# Patient Record
Sex: Male | Born: 2000 | Race: White | Hispanic: No | Marital: Single | Smoking: Never smoker
Health system: Southern US, Community
[De-identification: ages and names within clinical notes are randomized; demographics above are authoritative.]

## PROBLEM LIST (undated history)

## (undated) DIAGNOSIS — S32059A Unspecified fracture of fifth lumbar vertebra, initial encounter for closed fracture: Secondary | ICD-10-CM

## (undated) HISTORY — DX: Unspecified fracture of fifth lumbar vertebra, initial encounter for closed fracture: S32.059A

---
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
## 2022-01-06 DIAGNOSIS — J322 Chronic ethmoidal sinusitis: Secondary | ICD-10-CM | POA: Diagnosis not present

## 2022-02-27 ENCOUNTER — Telehealth: Payer: Self-pay | Admitting: Pediatrics

## 2022-02-27 NOTE — Telephone Encounter (Signed)
Left message with father to have patient call and schedule a new patient appointment with Dr Darrick Huntsman. Ok'd by Darrick Huntsman on 02/26/2022.

## 2022-04-08 ENCOUNTER — Ambulatory Visit: Payer: BLUE CROSS/BLUE SHIELD | Admitting: Internal Medicine

## 2022-04-22 ENCOUNTER — Ambulatory Visit (INDEPENDENT_AMBULATORY_CARE_PROVIDER_SITE_OTHER): Payer: 59 | Admitting: Internal Medicine

## 2022-04-22 ENCOUNTER — Other Ambulatory Visit (HOSPITAL_COMMUNITY)
Admission: RE | Admit: 2022-04-22 | Discharge: 2022-04-22 | Disposition: A | Discharge status: Home / Self Care | Payer: 59 | Source: Ambulatory Visit | Attending: Internal Medicine | Admitting: Internal Medicine

## 2022-04-22 ENCOUNTER — Encounter: Payer: Self-pay | Admitting: Internal Medicine

## 2022-04-22 VITALS — BP 122/88 | HR 72 | Temp 98.3°F | Ht 68.25 in | Wt 155.2 lb

## 2022-04-22 DIAGNOSIS — F419 Anxiety disorder, unspecified: Secondary | ICD-10-CM | POA: Diagnosis not present

## 2022-04-22 DIAGNOSIS — Z113 Encounter for screening for infections with a predominantly sexual mode of transmission: Secondary | ICD-10-CM

## 2022-04-22 DIAGNOSIS — Z87312 Personal history of (healed) stress fracture: Secondary | ICD-10-CM

## 2022-04-22 DIAGNOSIS — R69 Illness, unspecified: Secondary | ICD-10-CM | POA: Diagnosis not present

## 2022-04-22 DIAGNOSIS — F411 Generalized anxiety disorder: Secondary | ICD-10-CM | POA: Diagnosis not present

## 2022-04-22 DIAGNOSIS — Z0001 Encounter for general adult medical examination with abnormal findings: Secondary | ICD-10-CM | POA: Insufficient documentation

## 2022-04-22 LAB — COMPREHENSIVE METABOLIC PANEL
ALT: 20 U/L (ref 0–53)
AST: 21 U/L (ref 0–37)
Albumin: 5.2 g/dL (ref 3.5–5.2)
Alkaline Phosphatase: 73 U/L (ref 39–117)
BUN: 15 mg/dL (ref 6–23)
CO2: 27 mEq/L (ref 19–32)
Calcium: 9.8 mg/dL (ref 8.4–10.5)
Chloride: 100 mEq/L (ref 96–112)
Creatinine, Ser: 1.18 mg/dL (ref 0.40–1.50)
GFR: 88.89 mL/min (ref >60.00)
Glucose, Bld: 78 mg/dL (ref 70–99)
Potassium: 3.9 mEq/L (ref 3.5–5.1)
Sodium: 138 mEq/L (ref 135–145)
Total Bilirubin: 1.3 mg/dL — ABNORMAL HIGH (ref 0.2–1.2)
Total Protein: 7.7 g/dL (ref 6.0–8.3)

## 2022-04-22 LAB — CBC WITH DIFFERENTIAL/PLATELET
Basophils Absolute: 0 10*3/uL (ref 0.0–0.1)
Basophils Relative: 0.6 % (ref 0.0–3.0)
Eosinophils Absolute: 0.2 10*3/uL (ref 0.0–0.7)
Eosinophils Relative: 3.1 % (ref 0.0–5.0)
HCT: 51.4 % (ref 39.0–52.0)
Hemoglobin: 17.2 g/dL — ABNORMAL HIGH (ref 13.0–17.0)
Lymphocytes Relative: 39.6 % (ref 12.0–46.0)
Lymphs Abs: 2.2 10*3/uL (ref 0.7–4.0)
MCHC: 33.5 g/dL (ref 30.0–36.0)
MCV: 95 fl (ref 78.0–100.0)
Monocytes Absolute: 0.4 10*3/uL (ref 0.1–1.0)
Monocytes Relative: 7.7 % (ref 3.0–12.0)
Neutro Abs: 2.7 10*3/uL (ref 1.4–7.7)
Neutrophils Relative %: 49 % (ref 43.0–77.0)
Platelets: 195 10*3/uL (ref 150.0–400.0)
RBC: 5.41 Mil/uL (ref 4.22–5.81)
RDW: 12.9 % (ref 11.5–14.6)
WBC: 5.4 10*3/uL (ref 4.5–10.5)

## 2022-04-22 LAB — TSH: TSH: 1.03 u[IU]/mL (ref 0.35–5.50)

## 2022-04-22 MED ORDER — SERTRALINE HCL 50 MG PO TABS: 50.0000 mg | ORAL_TABLET | Freq: Every day | ORAL | 3 refills | Status: DC

## 2022-04-22 NOTE — Assessment & Plan Note (Signed)
No prior screening in sexually active college student.  HIV< Hep C, GC/Chlamydia to be screened today

## 2022-04-22 NOTE — Patient Instructions (Signed)
I agree with reducing your caffeine intake since this will aggravate anxiety  Please start the  Sertraline (generic for Zoloft) at 1/2 tablet daily in the morning with breakfast or with lunch for the first  4 or 5 days   to avoid  nausea.  You can increase to a full tablet after 4-5 days if you have  not developed side effects of nausea.    You should start to feel a difference after two weeks on the full dose .  Please schedule a 15 minute phone or virtual visit with me to follow up

## 2022-04-22 NOTE — Assessment & Plan Note (Signed)
L5,  occurred in 2017 during a soccer kick. Healed without intervention

## 2022-04-22 NOTE — Assessment & Plan Note (Addendum)
Screened for substance abuse,  Sexual abuse,  ADD ,  Mania,  OCD.  No panic attacks.  Agree with reduction in daily caffeine use.  Trial of zoloft starting dose 25 mg for several days,  Then 50 mg daily.  Follow up 2.5 weeks.  Screen for anemia, electrolyte disturbances and thyroid dysfunction

## 2022-04-22 NOTE — Progress Notes (Signed)
New Patient Office Visit  Subjective    Patient ID: Jacob Bolyard., male    DOB: 2001/03/04  Age: 21 y.o. MRN: [id number]  CC:  Chief Complaint  Patient presents with   Establish Care    New patient    HPI Jacob Hoeg Ewell Poe. ,  who prefers to be called "Jacob Meyer",  presents to establish care, referred by his father Jacob  Meyer year college student at [employer] .  Resumes classes on Monday  Cc:  anxiety described as excessive worrying about anything symptoms have been present since high school, buy has gotten progressively worse this summer.  Occurs while driving, almost had to pull over.  Has not happened during class, or during test taking.    Doing well in school .  GPS is 3.5  Drinks a lot of caffeine,  works out a lot at a [employer] .  Anxiety is worse at night (after 5 pm) and is aggravated by the caffeine so he has reduced the caffeine to 50 to 80 mg (down from 200 mg  in the form of Red Bull,  celsius ) . Denies alcohol and illicits.   He denies and recent or past history of sexual abuse, physical abuse,  drug abuse, and trauma   Outpatient Encounter Medications as of 04/22/2022  Medication Sig   ibuprofen (ADVIL,MOTRIN) 200 MG tablet Take 200 mg by mouth as needed.   sertraline (ZOLOFT) 50 MG tablet Take 1 tablet (50 mg total) by mouth daily.   [DISCONTINUED] doxycycline (DORYX) 100 MG EC tablet Take 100 mg by mouth 2 (two) times daily. (Patient not taking: Reported on 04/22/2022)   [DISCONTINUED] meloxicam (MOBIC) 7.5 MG tablet Take 1 tablet (7.5 mg total) by mouth daily. (Patient not taking: Reported on 04/22/2022)   No facility-administered encounter medications on file as of 04/22/2022.    Past Medical History:  Diagnosis Date   L5 vertebral fracture (HCC)    stress fracture during soccer kick    Past Surgical History:  Procedure Laterality Date   tonsillectomy  04/21/2012    No family history on file.  Social History    Socioeconomic History   Marital status: Single    Spouse name: Not on file   Number of children: Not on file   Years of education: Not on file   Highest education level: Not on file  Occupational History   Not on file  Tobacco Use   Smoking status: Never   Smokeless tobacco: Not on file  Substance and Sexual Activity   Alcohol use: Not on file   Drug use: Not on file   Sexual activity: Not on file  Other Topics Concern   Not on file  Social History Narrative   Not on file   Social Determinants of Health   Financial Resource Strain: Not on file  Food Insecurity: Not on file  Transportation Needs: Not on file  Physical Activity: Not on file  Stress: Not on file  Social Connections: Not on file  Intimate Partner Violence: Not on file    ROS   Patient denies headache, fevers, malaise, unintentional weight loss, skin rash, eye pain, sinus congestion and sinus pain, sore throat, dysphagia,  hemoptysis , cough, dyspnea, wheezing, chest pain, palpitations, orthopnea, edema, abdominal pain, nausea, melena, diarrhea, constipation, flank pain, dysuria, hematuria, urinary  Frequency, nocturia, numbness, tingling, seizures,  Focal weakness, Loss of consciousness,  Tremor, insomnia, depression, anxiety, and suicidal ideation.  Objective    BP 122/88 (BP Location: Left Arm, Patient Position: Sitting, Cuff Size: Normal)   Pulse 72   Temp 98.3 F (36.8 C) (Oral)   Ht 5' 8.25" (1.734 m)   Wt 155 lb 3.2 oz (70.4 kg)   SpO2 98%   BMI 23.43 kg/m   Physical Exam  General appearance: alert, cooperative and appears stated age Ears: normal TM's and external ear canals both ears Throat: lips, mucosa, and tongue normal; teeth and gums normal Neck: no adenopathy, no carotid bruit, supple, symmetrical, trachea midline and thyroid not enlarged, symmetric, no tenderness/mass/nodules Back: symmetric, no curvature. ROM normal. No CVA tenderness. Lungs: clear to auscultation  bilaterally Heart: regular rate and rhythm, S1, S2 normal, no murmur, click, rub or gallop Abdomen: soft, non-tender; bowel sounds normal; no masses,  no organomegaly Pulses: 2+ and symmetric Skin: Skin color, texture, turgor normal. No rashes or lesions Lymph nodes: Cervical, supraclavicular, and axillary nodes normal.    Assessment & Plan:   Problem List Items Addressed This Visit     History of vertebral stress fracture    L5,  occurred in 2017 during a soccer kick. Healed without intervention       Generalized anxiety disorder    Screened for substance abuse,  Sexual abuse,  ADD ,  Mania,  OCD.  No panic attacks.  Agree with reduction in daily caffeine use.  Trial of zoloft starting dose 25 mg for several days,  Then 50 mg daily.  Follow up 2.5 weeks.  Screen for anemia, electrolyte disturbances and thyroid dysfunction        Relevant Medications   sertraline (ZOLOFT) 50 MG tablet   Screening for STD (sexually transmitted disease)    No prior screening in sexually active college student.  HIV< Hep C, GC/Chlamydia to be screened today      Other Visit Diagnoses     Screen for STD (sexually transmitted disease)    -  Primary   Relevant Orders   HIV antibody (with reflex)   Hepatitis C antibody   Urine cytology ancillary only([facility])   Anxiety       Relevant Medications   sertraline (ZOLOFT) 50 MG tablet   Other Relevant Orders   TSH   Comprehensive metabolic panel   CBC with Differential/Platelet       No follow-ups on file.   Sherlene Shams, MD

## 2022-04-23 LAB — URINE CYTOLOGY ANCILLARY ONLY
Chlamydia: NEGATIVE
Comment: NEGATIVE
Comment: NORMAL
Neisseria Gonorrhea: NEGATIVE

## 2022-04-23 LAB — HEPATITIS C ANTIBODY: Hepatitis C Ab: NONREACTIVE

## 2022-04-23 LAB — HIV ANTIBODY (ROUTINE TESTING W REFLEX): HIV 1&2 Ab, 4th Generation: NONREACTIVE

## 2022-05-27 ENCOUNTER — Ambulatory Visit (INDEPENDENT_AMBULATORY_CARE_PROVIDER_SITE_OTHER): Payer: 59 | Admitting: Internal Medicine

## 2022-05-27 ENCOUNTER — Encounter: Payer: Self-pay | Admitting: Internal Medicine

## 2022-05-27 DIAGNOSIS — F411 Generalized anxiety disorder: Secondary | ICD-10-CM

## 2022-05-27 DIAGNOSIS — R69 Illness, unspecified: Secondary | ICD-10-CM | POA: Diagnosis not present

## 2022-05-27 NOTE — Patient Instructions (Signed)
Continue 50 mg zoloft until Saturday,  then increase dose to 100 mg daily  Send me a message in 2 weeks to let me know how it's working   Jacob Meyer

## 2022-05-28 NOTE — Progress Notes (Signed)
Virtual Visit via Caregility   Note    This format is felt to be most appropriate for this patient at this time.  All issues noted in this document were discussed and addressed.  No physical exam was performed (except for noted visual exam findings with Video Visits).   I connected with Jacob Meyer on 05/28/22 at  4:00 PM EDT by a video enabled telemedicine application  and verified that I am speaking with the correct person using two identifiers. Location patient: home Location provider: work or home office Persons participating in the virtual visit: patient, provider  I discussed the limitations, risks, security and privacy concerns of performing an evaluation and management service by telephone and the availability of in person appointments. I also discussed with the patient that there may be a patient responsible charge related to this service. The patient expressed understanding and agreed to proceed.   Reason for visit: follow up on anxiety   HP 21 yr old male with GAD recently started on zoloft  25 mg starting dose presents for folllo up. He is tolerating medicaton without side effects..  had one night recently where he overindulged in RedBull and did not sleep that night.  However all other nights has noted improved sleep patterns .  Marland Kitchen  He denies manic symptoms . suicidality and destructive behavior .Marland Kitchen      ROS: See pertinent positives and negatives per HPI.  Past Medical History:  Diagnosis Date   L5 vertebral fracture (HCC)    stress fracture during soccer kick    Past Surgical History:  Procedure Laterality Date   tonsillectomy  04/21/2012    No family history on file.  SOCIAL HX:  reports that he has never smoked. He does not have any smokeless tobacco history on file. No history on file for alcohol use and drug use.    Current Outpatient Medications:    ibuprofen (ADVIL,MOTRIN) 200 MG tablet, Take 200 mg by mouth as needed., Disp: , Rfl:    sertraline (ZOLOFT) 50 MG  tablet, Take 1 tablet (50 mg total) by mouth daily., Disp: 90 tablet, Rfl: 3  EXAM:  VITALS per patient if applicable:  GENERAL: alert, oriented, appears well and in no acute distress  HEENT: atraumatic, conjunttiva clear, no obvious abnormalities on inspection of external nose and ears  NECK: normal movements of the head and neck  LUNGS: on inspection no signs of respiratory distress, breathing rate appears normal, no obvious gross SOB, gasping or wheezing  CV: no obvious cyanosis  MS: moves all visible extremities without noticeable abnormality  PSYCH/NEURO: pleasant and cooperative, no obvious depression or anxiety, speech and thought processing grossly intact  ASSESSMENT AND PLAN:  Discussed the following assessment and plan:  Generalized anxiety disorder  Generalized anxiety disorder He is tolerating medicationo without side effects .  continue zoloft 50 mg daily for a full two weeks,  Increase dose to 100 mg if needed and follow upp    in two weeks     I discussed the assessment and treatment plan with the patient. The patient was provided an opportunity to ask questions and all were answered. The patient agreed with the plan and demonstrated an understanding of the instructions.   The patient was advised to call back or seek an in-person evaluation if the symptoms worsen or if the condition fails to improve as anticipated.   I spent 20 minutes dedicated to the care of this patient on the date of this encounter to include  pre-visit review of his medical history,  Face-to-face time with the patient , and post visit ordering of testing and therapeutics.    Crecencio Mc, MD

## 2022-05-28 NOTE — Assessment & Plan Note (Signed)
He is tolerating medicationo without side effects .  continue zoloft 50 mg daily for a full two weeks,  Increase dose to 100 mg if needed and follow upp    in two weeks

## 2022-06-12 ENCOUNTER — Encounter: Payer: Self-pay | Admitting: Internal Medicine

## 2022-06-13 NOTE — Telephone Encounter (Signed)
Pt would like to increase his Sertraline dose to 100 mg.

## 2022-06-15 ENCOUNTER — Other Ambulatory Visit: Payer: Self-pay | Admitting: Internal Medicine

## 2022-06-15 MED ORDER — SERTRALINE HCL 100 MG PO TABS: 100.0000 mg | ORAL_TABLET | Freq: Every day | ORAL | 0 refills | Status: DC

## 2022-07-20 ENCOUNTER — Encounter: Payer: Self-pay | Admitting: Internal Medicine

## 2022-07-25 NOTE — Telephone Encounter (Signed)
Pt would like to be called in regards to medication change

## 2022-07-26 ENCOUNTER — Other Ambulatory Visit: Payer: Self-pay | Admitting: Internal Medicine

## 2022-07-26 MED ORDER — VENLAFAXINE HCL ER 37.5 MG PO CP24: 37.5000 mg | ORAL_CAPSULE | Freq: Every day | ORAL | 0 refills | Status: DC

## 2022-08-15 ENCOUNTER — Telehealth: Payer: 59 | Admitting: Internal Medicine

## 2022-08-15 ENCOUNTER — Encounter: Payer: Self-pay | Admitting: Internal Medicine

## 2022-08-15 VITALS — Ht 68.25 in | Wt 154.0 lb

## 2022-08-15 DIAGNOSIS — F411 Generalized anxiety disorder: Secondary | ICD-10-CM | POA: Diagnosis not present

## 2022-08-15 DIAGNOSIS — R69 Illness, unspecified: Secondary | ICD-10-CM | POA: Diagnosis not present

## 2022-08-15 MED ORDER — VENLAFAXINE HCL ER 37.5 MG PO CP24: 75.0000 mg | ORAL_CAPSULE | Freq: Every day | ORAL | 0 refills | Status: DC

## 2022-08-15 NOTE — Progress Notes (Unsigned)
Virtual Visit via Caregility   Note    This format is felt to be most appropriate for this patient at this time.  All issues noted in this document were discussed and addressed.  No physical exam was performed (except for noted visual exam findings with Video Visits).   I connected withNAME on 08/17/22 at  4:45 PM EST by a video enabled telemedicine application or telephone and verified that I am speaking with the correct person using two identifiers. Location patient: home Location provider: work or home office Persons participating in the virtual visit: patient, provider  I discussed the limitations, risks, security and privacy concerns of performing an evaluation and management service by telephone and the availability of in person appointments. I also discussed with the patient that there may be a patient responsible charge related to this service. The patient expressed understanding and agreed to proceed.  Reason for visit: follow up on medication change   HPI:  Recent change in medication therapy for GAD from sertraline to venlafaxine due to sexual side effect of anorgasmia.  Side effects have resolved with medication  change.  His anxiety has improved as well, ,not worrying about things.  Wonders if he even needs the meds..  felt light headed when he missed a dose.    ROS: See pertinent positives and negatives per HPI.  Past Medical History:  Diagnosis Date   L5 vertebral fracture (HCC)    stress fracture during soccer kick    Past Surgical History:  Procedure Laterality Date   tonsillectomy  04/21/2012    History reviewed. No pertinent family history.  SOCIAL HX: Archivist.  Does not use illicits.  heterosexual   Current Outpatient Medications:    venlafaxine XR (EFFEXOR XR) 37.5 MG 24 hr capsule, Take 2 capsules (75 mg total) by mouth daily with breakfast., Disp: 180 capsule, Rfl: 0  EXAM:  VITALS per patient if applicable:  GENERAL: alert, oriented, appears  well and in no acute distress  HEENT: atraumatic, conjunttiva clear, no obvious abnormalities on inspection of external nose and ears  NECK: normal movements of the head and neck  LUNGS: on inspection no signs of respiratory distress, breathing rate appears normal, no obvious gross SOB, gasping or wheezing  CV: no obvious cyanosis  MS: moves all visible extremities without noticeable abnormality  PSYCH/NEURO: pleasant and cooperative, no obvious depression or anxiety, speech and thought processing grossly intact  ASSESSMENT AND PLAN:  Discussed the following assessment and plan:  Generalized anxiety disorder  Generalized anxiety disorder He developed anorgasmia on zoloft and is tolerating change in medication to venlafaxine without side effects,  unless he misses a dose.  No changes today     I discussed the assessment and treatment plan with the patient. The patient was provided an opportunity to ask questions and all were answered. The patient agreed with the plan and demonstrated an understanding of the instructions.   The patient was advised to call back or seek an in-person evaluation if the symptoms worsen or if the condition fails to improve as anticipated.   I spent 20 minutes dedicated to the care of this patient on the date of this encounter to include pre-visit review of his medical history,  Face-to-face time with the patient , and post visit ordering of testing and therapeutics.    Sherlene Shams, MD

## 2022-08-15 NOTE — Patient Instructions (Signed)
Please continue your current dose of venlafaxine (37.5 mg daily)  I sent an rx to [facility] with a new direction of "2 capsules daily"  to give you enough [place].  Have a wonderful Christmas and enjoy [place]!

## 2022-08-17 NOTE — Assessment & Plan Note (Signed)
He developed anorgasmia on zoloft and is tolerating change in medication to venlafaxine without side effects,  unless he misses a dose.  No changes today

## 2022-08-27 ENCOUNTER — Ambulatory Visit: Payer: 59 | Admitting: Family Medicine

## 2022-08-27 ENCOUNTER — Ambulatory Visit: Payer: 59

## 2022-08-27 ENCOUNTER — Encounter: Payer: Self-pay | Admitting: Family Medicine

## 2022-08-27 VITALS — BP 100/70 | HR 56 | Temp 99.0°F | Ht 68.0 in | Wt 156.2 lb

## 2022-08-27 DIAGNOSIS — N50819 Testicular pain, unspecified: Secondary | ICD-10-CM | POA: Insufficient documentation

## 2022-08-27 DIAGNOSIS — N50812 Left testicular pain: Secondary | ICD-10-CM | POA: Diagnosis not present

## 2022-08-27 NOTE — Assessment & Plan Note (Signed)
Undetermined cause.  His history is not consistent with torsion.  His exam is not consistent with epididymitis.  There are no masses on exam.  Discussed getting an ultrasound to evaluate further.  Patient would like to try to do this before the end of the year.  Discussed that we may be able to accomplish that though given that it is the holidays it may be difficult.

## 2022-08-27 NOTE — Progress Notes (Signed)
  Marikay Alar, MD Phone: [phone]  Jacob Meyer. is a 21 y.o. male who presents today for same-day visit.  Testicular discomfort: Patient notes this has been going on 2 months.  The left testicle has an aching discomfort that bothers him more when he ejaculates.  He notes no injury to the area.  He notes no masses.  He notes no dysuria or penile discharge.  He is sexually active and at times does not use condoms.  He does note the left testicle is a little higher than the right testicle and does seem to come more proximally during the ejaculatory process.  Social History   Tobacco Use  Smoking Status Never  Smokeless Tobacco Not on file    Current Outpatient Medications on File Prior to Visit  Medication Sig Dispense Refill   venlafaxine XR (EFFEXOR XR) 37.5 MG 24 hr capsule Take 2 capsules (75 mg total) by mouth daily with breakfast. 180 capsule 0   No current facility-administered medications on file prior to visit.     ROS see history of present illness  Objective  Physical Exam Vitals:   08/27/22 0936  BP: 100/70  Pulse: (!) 56  Temp: 99 F (37.2 C)  SpO2: 99%    BP Readings from Last 3 Encounters:  08/27/22 100/70  04/22/22 122/88  12/04/15 124/63 (88 %, Z = 1.17 /  49 %, Z = -0.03)*   *BP percentiles are based on the 2017 AAP Clinical Practice Guideline for boys   Wt Readings from Last 3 Encounters:  08/27/22 156 lb 3.2 oz (70.9 kg)  08/15/22 154 lb (69.9 kg)  05/27/22 154 lb (69.9 kg)    Physical Exam Genitourinary:    Comments: Normal circumcised penis with no lesions noted, bilateral scrotum normal, left testicle with slight discomfort on palpation of the testicle, right testicle normal, no masses in either testicle, bilateral epididymides with no tenderness or enlargement, normal vas deferens     Assessment/Plan: Please see individual problem list.  Pain in left testicle Assessment & Plan: Undetermined cause.  His history is  not consistent with torsion.  His exam is not consistent with epididymitis.  There are no masses on exam.  Discussed getting an ultrasound to evaluate further.  Patient would like to try to do this before the end of the year.  Discussed that we may be able to accomplish that though given that it is the holidays it may be difficult.  Orders: -     US SCROTUM W/DOPPLER; Future     No follow-ups on file.   Marikay Alar, MD [facility]

## 2022-09-02 ENCOUNTER — Ambulatory Visit: Payer: 59

## 2022-09-02 ENCOUNTER — Ambulatory Visit: Payer: 59 | Admitting: Nurse Practitioner

## 2022-09-18 ENCOUNTER — Other Ambulatory Visit: Payer: 59

## 2022-09-19 ENCOUNTER — Telehealth: Payer: Self-pay | Admitting: Family Medicine

## 2022-09-19 NOTE — Telephone Encounter (Signed)
I called the patient and he stated he would call the insurance to see who is in network and he would call us back and let us know.  Nicola Quesnell,cma

## 2022-09-19 NOTE — Telephone Encounter (Signed)
Please let the patient know that his testicular ultrasound did not reveal any testicle abnormalities.  He does have a cyst in his left epididymis that is benign.  He has trace bilateral hydroceles which is some fluid near the testicle.  Please see if he is still having discomfort in his testicle.

## 2022-09-19 NOTE — Telephone Encounter (Signed)
I would like to refer him to urology for the hydroceles to see if they thing these could be contributing. There should not be any follow-up needed for the cyst.

## 2022-09-19 NOTE — Telephone Encounter (Signed)
Noted  

## 2022-09-19 NOTE — Telephone Encounter (Signed)
I called the patient and informed him of his [place] results and he understood, he stated he is still having discomfort and wants to know what would his next steps be for the cyst and the fluid.  Chamya Hunton,cma

## 2022-09-23 ENCOUNTER — Encounter: Payer: Self-pay | Admitting: Family Medicine

## 2022-09-23 DIAGNOSIS — N5082 Scrotal pain: Secondary | ICD-10-CM

## 2022-09-23 DIAGNOSIS — N433 Hydrocele, unspecified: Secondary | ICD-10-CM

## 2022-09-25 NOTE — Telephone Encounter (Signed)
Pt called in staying that he called the urology office and that referral need to be send over again. I did read to him the message below from Dr. Caryl Bis about the 2 weeks wait from them, per pt he leaves to [place] next week 01/25. So he would to know the status of this referral.

## 2022-09-27 ENCOUNTER — Encounter: Payer: Self-pay | Admitting: Internal Medicine

## 2022-09-29 ENCOUNTER — Other Ambulatory Visit: Payer: Self-pay | Admitting: Internal Medicine

## 2022-10-01 MED ORDER — OSELTAMIVIR PHOSPHATE 75 MG PO CAPS: 75.0000 mg | ORAL_CAPSULE | Freq: Two times a day (BID) | ORAL | 0 refills | Status: DC

## 2023-03-21 ENCOUNTER — Encounter: Payer: Self-pay | Admitting: Internal Medicine

## 2023-03-24 MED ORDER — VENLAFAXINE HCL ER 37.5 MG PO CP24: ORAL_CAPSULE | ORAL | 0 refills | Status: DC

## 2023-03-24 NOTE — Addendum Note (Signed)
Addended by: Sandy Salaam on: 03/24/2023 03:39 PM   Modules accepted: Orders

## 2023-03-25 ENCOUNTER — Other Ambulatory Visit: Payer: Self-pay | Admitting: Internal Medicine

## 2023-03-26 MED ORDER — VENLAFAXINE HCL ER 37.5 MG PO CP24: 37.5000 mg | ORAL_CAPSULE | Freq: Every day | ORAL | 0 refills | Status: DC

## 2023-03-26 NOTE — Addendum Note (Signed)
Addended by: Sandy Salaam on: 03/26/2023 04:03 PM   Modules accepted: Orders

## 2023-05-03 ENCOUNTER — Encounter: Payer: Self-pay | Admitting: Internal Medicine

## 2023-05-26 ENCOUNTER — Encounter: Payer: Self-pay | Admitting: Internal Medicine

## 2023-06-11 ENCOUNTER — Encounter: Payer: Self-pay | Admitting: Internal Medicine

## 2023-06-11 ENCOUNTER — Ambulatory Visit (INDEPENDENT_AMBULATORY_CARE_PROVIDER_SITE_OTHER): Payer: BLUE CROSS/BLUE SHIELD | Admitting: Internal Medicine

## 2023-06-11 VITALS — BP 112/68 | HR 73 | Ht 68.0 in | Wt 161.2 lb

## 2023-06-11 DIAGNOSIS — F411 Generalized anxiety disorder: Secondary | ICD-10-CM

## 2023-06-11 DIAGNOSIS — Z23 Encounter for immunization: Secondary | ICD-10-CM | POA: Diagnosis not present

## 2023-06-11 DIAGNOSIS — Z113 Encounter for screening for infections with a predominantly sexual mode of transmission: Secondary | ICD-10-CM | POA: Diagnosis not present

## 2023-06-11 DIAGNOSIS — Z0001 Encounter for general adult medical examination with abnormal findings: Secondary | ICD-10-CM | POA: Diagnosis not present

## 2023-06-11 DIAGNOSIS — R5383 Other fatigue: Secondary | ICD-10-CM | POA: Diagnosis not present

## 2023-06-11 DIAGNOSIS — E782 Mixed hyperlipidemia: Secondary | ICD-10-CM | POA: Diagnosis not present

## 2023-06-11 MED ORDER — TIZANIDINE HCL 2 MG PO TABS: 2.0000 mg | ORAL_TABLET | Freq: Four times a day (QID) | ORAL | 0 refills | Status: AC | PRN

## 2023-06-11 NOTE — Assessment & Plan Note (Signed)
COUNSELLED about having unprotected sex .  Screening today wil include RPR given his report of a painless penile lesion in January.  Exam today notes a tiny hyerpigmented macule on the head of his penis.

## 2023-06-11 NOTE — Patient Instructions (Signed)
Tizandiine is a muscle relaxer.  I have prescribed the lowest dose . You can combine it with motrin and tylenol if needed for muscle spasm  The dose can be increased to 4 mg if needed.  DON'T DRIVE IF YOU HAVE TAKEN IT UNTIL YOU KNOW HOW MUCH IT AFFECTS YOUR ALERTNESS

## 2023-06-11 NOTE — Progress Notes (Signed)
Patient ID: Jacob Sackmann Ewell Poe., male    DOB: 22-Sep-2000  Age: 22 y.o. MRN: [id number]  The patient is here for annual preventive examination and management of other chronic and acute problems.   The risk factors are reflected in the social history.   The roster of all physicians providing medical care to patient - is listed in the Snapshot section of the chart.   Activities of daily living:  The patient is 100% independent in all ADLs: dressing, toileting, feeding as well as independent mobility   Home safety : The patient has smoke detectors in the home. They wear seatbelts.  There are no unsecured firearms at home. There is no violence in the home.    There  are risks for hepatitis, STDs or HIV because patient has had unprotected sex in last several months.  And developed two painless papules on the shaft of his penis  recently. . There is no   history of blood transfusion. They have no travel history to infectious disease endemic areas of the world.   The patient has seen their dentist in the last six month. They have seen their eye doctor in the last year.   They do not  have excessive sun exposure. Discussed the need for sun protection: hats, long sleeves and use of sunscreen if there is significant sun exposure.    Diet: the importance of a healthy diet is discussed. They do have a healthy diet.   The benefits of regular aerobic exercise were discussed. The patient  exercises  3 to 5 days per week  for  60 minutes.    Depression screen: there are no signs or vegative symptoms of depression- irritability, change in appetite, anhedonia, sadness/tearfullness.   The following portions of the patient's history were reviewed and updated as appropriate: allergies, current medications, past family history, past medical history,  past surgical history, past social history  and problem list.   Visual acuity was not assessed per patient preference since the patient has regular follow up with  an  ophthalmologist. Hearing and body mass index were assessed and reviewed.    During the course of the visit the patient was educated and counseled about appropriate screening and preventive services including : fall prevention , diabetes screening, nutrition counseling, colorectal cancer screening, and recommended immunizations.    Chief Complaint:   none.  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   Anxiety: untreated currently due to FAILURE TO CLIMAX  on zoloft.  Stopped effexor one moth ago  withdrawal side effects ("brain shocks") finally stopped 2 weeks ago . Has reduced his caffeine intake daily and exercising .    Jan 2024:  had  2 penile lesions, painless,  not ulcers  lasted a week  after unprotected sex.  Worried about syphilis     Review of Symptoms  Patient denies headache, fevers, malaise, unintentional weight loss, skin rash, eye pain, sinus congestion and sinus pain, sore throat, dysphagia,  hemoptysis , cough, dyspnea, wheezing, chest pain, palpitations, orthopnea, edema, abdominal pain, nausea, melena, diarrhea, constipation, flank pain, dysuria, hematuria, urinary  Frequency, nocturia, numbness, tingling, seizures,  Focal weakness, Loss of consciousness,  Tremor, insomnia, depression, anxiety, and suicidal ideation.    Physical Exam:  BP 112/68   Pulse 73   Ht 5\' 8"  (1.727 m)   Wt 161 lb 3.2 oz (73.1 kg)   SpO2 98%   BMI 24.51 kg/m    Physical Exam Vitals reviewed.  Constitutional:  General: He is not in acute distress.    Appearance: Normal appearance. He is normal weight. He is not ill-appearing, toxic-appearing or diaphoretic.  HENT:     Head: Normocephalic and atraumatic.     Right Ear: Tympanic membrane, ear canal and external ear normal. There is no impacted cerumen.     Left Ear: Tympanic membrane, ear canal and external ear normal. There is no impacted cerumen.     Nose: Nose normal.     Mouth/Throat:     Mouth: Mucous membranes are  moist.     Pharynx: Oropharynx is clear.  Eyes:     General: No scleral icterus.       Right eye: No discharge.        Left eye: No discharge.     Conjunctiva/sclera: Conjunctivae normal.  Neck:     Thyroid: No thyromegaly.     Vascular: No carotid bruit or JVD.  Cardiovascular:     Rate and Rhythm: Normal rate and regular rhythm.     Heart sounds: Normal heart sounds.  Pulmonary:     Effort: Pulmonary effort is normal. No respiratory distress.     Breath sounds: Normal breath sounds.  Abdominal:     General: Bowel sounds are normal.     Palpations: Abdomen is soft. There is no mass.     Tenderness: There is no abdominal tenderness. There is no guarding or rebound.  Musculoskeletal:        General: Normal range of motion.     Cervical back: Normal range of motion and neck supple.  Lymphadenopathy:     Cervical: No cervical adenopathy.  Skin:    General: Skin is warm and dry.  Neurological:     General: No focal deficit present.     Mental Status: He is alert and oriented to person, place, and time. Mental status is at baseline.  Psychiatric:        Mood and Affect: Mood normal.        Behavior: Behavior normal.        Thought Content: Thought content normal.        Judgment: Judgment normal.    Assessment and Plan: Screen for STD (sexually transmitted disease) -     RPR -     HIV Antibody (routine testing w rflx) -     Hepatitis C antibody -     GC/Chlamydia Probe Amp  Other fatigue -     Comprehensive metabolic panel -     CBC with Differential/Platelet  Moderate mixed hyperlipidemia not requiring statin therapy -     Lipid panel  Encounter for health maintenance examination with abnormal findings Assessment & Plan: COUNSELLED about having unprotected sex .  Screening today wil include RPR given his report of a painless penile lesion in January.  Exam today notes a tiny hyerpigmented macule on the head of his penis.    Need for influenza vaccination -     Flu  vaccine trivalent PF, 6mos and older(Flulaval,Afluria,Fluarix,Fluzone)  Generalized anxiety disorder Assessment & Plan: He developed anorgasmia on zoloft and requested a change in medication . He had several episodes of withdrawal side effects from  delaying or missing doses of venlafaxine and has decided to stop the medication    Other orders -     tiZANidine HCl; Take 1 tablet (2 mg total) by mouth every 6 (six) hours as needed for muscle spasms.  Dispense: 30 tablet; Refill: 0 -     GC/Chlamydia  Probe Amp    No follow-ups on file.  Sherlene Shams, MD

## 2023-06-13 NOTE — Assessment & Plan Note (Signed)
He developed anorgasmia on zoloft and requested a change in medication . He had several episodes of withdrawal side effects from  delaying or missing doses of venlafaxine and has decided to stop the medication

## 2023-06-14 LAB — CBC WITH DIFFERENTIAL/PLATELET
Basophils Absolute: 0 10*3/uL (ref 0.0–0.2)
Basos: 1 %
EOS (ABSOLUTE): 0.2 10*3/uL (ref 0.0–0.4)
Eos: 3 %
Hematocrit: 50.7 % (ref 37.5–51.0)
Hemoglobin: 17 g/dL (ref 13.0–17.7)
Immature Grans (Abs): 0 10*3/uL (ref 0.0–0.1)
Immature Granulocytes: 0 %
Lymphocytes Absolute: 1.7 10*3/uL (ref 0.7–3.1)
Lymphs: 34 %
MCH: 31.8 pg (ref 26.6–33.0)
MCHC: 33.5 g/dL (ref 31.5–35.7)
MCV: 95 fL (ref 79–97)
Monocytes Absolute: 0.5 10*3/uL (ref 0.1–0.9)
Monocytes: 9 %
Neutrophils Absolute: 2.6 10*3/uL (ref 1.4–7.0)
Neutrophils: 53 %
Platelets: 201 10*3/uL (ref 150–450)
RBC: 5.35 x10E6/uL (ref 4.14–5.80)
RDW: 12.3 % (ref 11.6–15.4)
WBC: 5 10*3/uL (ref 3.4–10.8)

## 2023-06-14 LAB — RPR: RPR Ser Ql: NONREACTIVE

## 2023-06-14 LAB — LIPID PANEL
Chol/HDL Ratio: 2.9 {ratio} (ref 0.0–5.0)
Cholesterol, Total: 156 mg/dL (ref 100–199)
HDL: 53 mg/dL (ref >39)
LDL Chol Calc (NIH): 88 mg/dL (ref 0–99)
Triglycerides: 79 mg/dL (ref 0–149)
VLDL Cholesterol Cal: 15 mg/dL (ref 5–40)

## 2023-06-14 LAB — HEPATITIS C ANTIBODY: Hep C Virus Ab: NONREACTIVE

## 2023-06-14 LAB — COMPREHENSIVE METABOLIC PANEL
ALT: 33 [IU]/L (ref 0–44)
AST: 26 [IU]/L (ref 0–40)
Albumin: 5 g/dL (ref 4.3–5.2)
Alkaline Phosphatase: 71 [IU]/L (ref 44–121)
BUN/Creatinine Ratio: 10 (ref 9–20)
BUN: 12 mg/dL (ref 6–20)
Bilirubin Total: 0.5 mg/dL (ref 0.0–1.2)
CO2: 25 mmol/L (ref 20–29)
Calcium: 9.9 mg/dL (ref 8.7–10.2)
Chloride: 100 mmol/L (ref 96–106)
Creatinine, Ser: 1.26 mg/dL (ref 0.76–1.27)
Globulin, Total: 2.3 g/dL (ref 1.5–4.5)
Glucose: 86 mg/dL (ref 70–99)
Potassium: 4.6 mmol/L (ref 3.5–5.2)
Sodium: 139 mmol/L (ref 134–144)
Total Protein: 7.3 g/dL (ref 6.0–8.5)
eGFR: 83 mL/min/{1.73_m2} (ref >59)

## 2023-06-14 LAB — HIV ANTIBODY (ROUTINE TESTING W REFLEX): HIV Screen 4th Generation wRfx: NONREACTIVE

## 2023-06-14 LAB — GC/CHLAMYDIA PROBE AMP

## 2023-06-21 ENCOUNTER — Other Ambulatory Visit: Payer: Self-pay | Admitting: Internal Medicine

## 2023-07-14 ENCOUNTER — Ambulatory Visit: Payer: BLUE CROSS/BLUE SHIELD | Admitting: Internal Medicine
